# Patient Record
Sex: Male | Born: 1990 | Race: Black or African American | Hispanic: No | Marital: Single | Smoking: Current every day smoker
Health system: Southern US, Community
[De-identification: ages and names within clinical notes are randomized; demographics above are authoritative.]

---

## 2015-07-23 ENCOUNTER — Emergency Department (HOSPITAL_BASED_OUTPATIENT_CLINIC_OR_DEPARTMENT_OTHER)
Admission: EM | Admit: 2015-07-23 | Discharge: 2015-07-23 | Disposition: A | Discharge status: Home / Self Care | Payer: No Typology Code available for payment source | Attending: Emergency Medicine | Admitting: Emergency Medicine

## 2015-07-23 ENCOUNTER — Encounter (HOSPITAL_BASED_OUTPATIENT_CLINIC_OR_DEPARTMENT_OTHER): Payer: Self-pay | Admitting: *Deleted

## 2015-07-23 ENCOUNTER — Emergency Department (HOSPITAL_BASED_OUTPATIENT_CLINIC_OR_DEPARTMENT_OTHER): Payer: No Typology Code available for payment source

## 2015-07-23 DIAGNOSIS — Y998 Other external cause status: Secondary | ICD-10-CM | POA: Insufficient documentation

## 2015-07-23 DIAGNOSIS — S0001XA Abrasion of scalp, initial encounter: Secondary | ICD-10-CM | POA: Insufficient documentation

## 2015-07-23 DIAGNOSIS — S60512A Abrasion of left hand, initial encounter: Secondary | ICD-10-CM | POA: Insufficient documentation

## 2015-07-23 DIAGNOSIS — Y9389 Activity, other specified: Secondary | ICD-10-CM | POA: Insufficient documentation

## 2015-07-23 DIAGNOSIS — F17210 Nicotine dependence, cigarettes, uncomplicated: Secondary | ICD-10-CM | POA: Insufficient documentation

## 2015-07-23 DIAGNOSIS — S50812A Abrasion of left forearm, initial encounter: Secondary | ICD-10-CM | POA: Insufficient documentation

## 2015-07-23 DIAGNOSIS — Y9289 Other specified places as the place of occurrence of the external cause: Secondary | ICD-10-CM | POA: Insufficient documentation

## 2015-07-23 DIAGNOSIS — S29001A Unspecified injury of muscle and tendon of front wall of thorax, initial encounter: Secondary | ICD-10-CM | POA: Insufficient documentation

## 2015-07-23 DIAGNOSIS — S40812A Abrasion of left upper arm, initial encounter: Secondary | ICD-10-CM | POA: Insufficient documentation
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
## 2020-06-27 ENCOUNTER — Other Ambulatory Visit: Payer: Self-pay

## 2020-06-27 DIAGNOSIS — Z20822 Contact with and (suspected) exposure to covid-19: Secondary | ICD-10-CM

## 2020-06-30 LAB — NOVEL CORONAVIRUS, NAA: SARS-CoV-2, NAA: DETECTED — AB
